# Patient Record
Sex: Male | Born: 1955 | Hispanic: No | Marital: Single
Health system: Southern US, Community
[De-identification: ages and names within clinical notes are randomized; demographics above are authoritative.]

## PROBLEM LIST (undated history)

## (undated) DIAGNOSIS — E785 Hyperlipidemia, unspecified: Secondary | ICD-10-CM

## (undated) DIAGNOSIS — I10 Essential (primary) hypertension: Secondary | ICD-10-CM

---

## 2020-01-23 ENCOUNTER — Other Ambulatory Visit: Payer: Self-pay

## 2020-01-23 ENCOUNTER — Emergency Department (HOSPITAL_COMMUNITY)
Admission: EM | Admit: 2020-01-23 | Discharge: 2020-01-23 | Disposition: A | Discharge status: Home / Self Care | Payer: Self-pay | Attending: Emergency Medicine | Admitting: Emergency Medicine

## 2020-01-23 ENCOUNTER — Emergency Department (HOSPITAL_COMMUNITY): Payer: Self-pay

## 2020-01-23 ENCOUNTER — Encounter (HOSPITAL_COMMUNITY): Payer: Self-pay | Admitting: Emergency Medicine

## 2020-01-23 DIAGNOSIS — R110 Nausea: Secondary | ICD-10-CM | POA: Insufficient documentation

## 2020-01-23 DIAGNOSIS — R1011 Right upper quadrant pain: Secondary | ICD-10-CM | POA: Insufficient documentation

## 2020-01-23 DIAGNOSIS — R5383 Other fatigue: Secondary | ICD-10-CM | POA: Insufficient documentation

## 2020-01-23 DIAGNOSIS — Z79899 Other long term (current) drug therapy: Secondary | ICD-10-CM | POA: Insufficient documentation

## 2020-01-23 DIAGNOSIS — I10 Essential (primary) hypertension: Secondary | ICD-10-CM | POA: Insufficient documentation

## 2020-01-23 DIAGNOSIS — Z20822 Contact with and (suspected) exposure to covid-19: Secondary | ICD-10-CM | POA: Insufficient documentation

## 2020-01-23 DIAGNOSIS — R1114 Bilious vomiting: Secondary | ICD-10-CM

## 2020-01-23 HISTORY — DX: Essential (primary) hypertension: I10

## 2020-01-23 HISTORY — DX: Hyperlipidemia, unspecified: E78.5

## 2020-01-23 LAB — CBC WITH DIFFERENTIAL/PLATELET
Abs Immature Granulocytes: 0 10*3/uL (ref 0.00–0.07)
Basophils Absolute: 0 10*3/uL (ref 0.0–0.1)
Basophils Relative: 0 %
Eosinophils Absolute: 0 10*3/uL (ref 0.0–0.5)
Eosinophils Relative: 0 %
HCT: 41.2 % (ref 39.0–52.0)
Hemoglobin: 12.9 g/dL — ABNORMAL LOW (ref 13.0–17.0)
Lymphocytes Relative: 28 %
Lymphs Abs: 4 10*3/uL (ref 0.7–4.0)
MCH: 24.4 pg — ABNORMAL LOW (ref 26.0–34.0)
MCHC: 31.3 g/dL (ref 30.0–36.0)
MCV: 78 fL — ABNORMAL LOW (ref 80.0–100.0)
Monocytes Absolute: 0.1 10*3/uL (ref 0.1–1.0)
Monocytes Relative: 1 %
Neutro Abs: 10.2 10*3/uL — ABNORMAL HIGH (ref 1.7–7.7)
Neutrophils Relative %: 71 %
Platelets: 239 10*3/uL (ref 150–400)
RBC: 5.28 MIL/uL (ref 4.22–5.81)
RDW: 15.4 % (ref 11.5–15.5)
WBC: 14.3 10*3/uL — ABNORMAL HIGH (ref 4.0–10.5)
nRBC: 0 % (ref 0.0–0.2)
nRBC: 0 /100 WBC

## 2020-01-23 LAB — COMPREHENSIVE METABOLIC PANEL
ALT: 17 U/L (ref 0–44)
AST: 20 U/L (ref 15–41)
Albumin: 4 g/dL (ref 3.5–5.0)
Alkaline Phosphatase: 40 U/L (ref 38–126)
Anion gap: 14 (ref 5–15)
BUN: 20 mg/dL (ref 8–23)
CO2: 22 mmol/L (ref 22–32)
Calcium: 9.6 mg/dL (ref 8.9–10.3)
Chloride: 100 mmol/L (ref 98–111)
Creatinine, Ser: 0.96 mg/dL (ref 0.61–1.24)
GFR calc Af Amer: >60 mL/min (ref >60)
GFR calc non Af Amer: >60 mL/min (ref >60)
Glucose, Bld: 129 mg/dL — ABNORMAL HIGH (ref 70–99)
Potassium: 3.4 mmol/L — ABNORMAL LOW (ref 3.5–5.1)
Sodium: 136 mmol/L (ref 135–145)
Total Bilirubin: 0.2 mg/dL — ABNORMAL LOW (ref 0.3–1.2)
Total Protein: 8 g/dL (ref 6.5–8.1)

## 2020-01-23 LAB — URINALYSIS, ROUTINE W REFLEX MICROSCOPIC
Bilirubin Urine: NEGATIVE
Glucose, UA: 50 mg/dL — AB
Hgb urine dipstick: NEGATIVE
Ketones, ur: 5 mg/dL — AB
Leukocytes,Ua: NEGATIVE
Nitrite: NEGATIVE
Protein, ur: NEGATIVE mg/dL
Specific Gravity, Urine: 1.011 (ref 1.005–1.030)
pH: 7 (ref 5.0–8.0)

## 2020-01-23 LAB — RESPIRATORY PANEL BY RT PCR (FLU A&B, COVID)
Influenza A by PCR: NEGATIVE
Influenza B by PCR: NEGATIVE
SARS Coronavirus 2 by RT PCR: NEGATIVE

## 2020-01-23 LAB — LIPASE, BLOOD: Lipase: 22 U/L (ref 11–51)

## 2020-01-23 LAB — TROPONIN I (HIGH SENSITIVITY): Troponin I (High Sensitivity): 4 ng/L (ref <18)

## 2020-01-23 MED ORDER — DICYCLOMINE HCL 20 MG PO TABS: 20.0000 mg | ORAL_TABLET | Freq: Two times a day (BID) | ORAL | 0 refills | Status: AC

## 2020-01-23 MED ORDER — DICYCLOMINE HCL 10 MG PO CAPS
10.0000 mg | ORAL_CAPSULE | Freq: Once | ORAL | Status: AC
  Administered 2020-01-23: 10 mg via ORAL
  Filled 2020-01-23: qty 1

## 2020-01-23 MED ORDER — METOCLOPRAMIDE HCL 10 MG PO TABS: 10.0000 mg | ORAL_TABLET | Freq: Four times a day (QID) | ORAL | 0 refills | Status: AC | PRN

## 2020-01-23 MED ORDER — METOCLOPRAMIDE HCL 5 MG/ML IJ SOLN
10.0000 mg | Freq: Once | INTRAMUSCULAR | Status: AC
  Administered 2020-01-23: 10 mg via INTRAVENOUS
  Filled 2020-01-23: qty 2

## 2020-01-23 NOTE — ED Notes (Signed)
Pt st's he does not feel well enough to be discharged.  St's he continues to be dizzy.  Explained to pt that he had two Rx's that would help with his symptoms.  Pt requesting to speak to provider.

## 2020-01-23 NOTE — Discharge Instructions (Addendum)
   Solicite ayuda inmediatamente si: [employer], el cuello, los b[place] o la [place]. Se siente muy dbil o se desmaya. Vomita y el vmito es de color rojo intenso o se asemeja al poso del caf. Tiene heces con sangre, de color negro, o con aspecto alquitranado. Siente dolor de cabeza intenso, rigidez en el cuello, o ambas cosas. Tiene dolor intenso, clicos o distensin abdominal. Tiene dificultades para respirar o respira muy rpido. Su corazn late muy rpidamente. Siente la piel fra y hmeda. Se siente confundido. Tiene signos de deshidratacin, como los siguientes: Orina de color oscuro, muy escasa o falta de [place]. Labios agrietados. Sequedad de boca. Ojos hundidos. Somnolencia. Debilidad.

## 2020-01-23 NOTE — ED Notes (Signed)
Pt remains in ultrasound at this time.

## 2020-01-23 NOTE — ED Provider Notes (Signed)
[facility] EMERGENCY DEPARTMENT Provider Note   CSN: [id number] Arrival date & time: 01/23/20  1205     History Chief Complaint  Patient presents with  . Dizziness  . Abdominal Pain    Cody Reeves is a 64 y.o. male with past medical history hyperlipidemia and hypertension who presents emergency department for abdominal discomfort.  There is a language barrier and translation services are utilized however the history is limited and somewhat contradictory.  Essentially states that he ate since that time he has felt "generally weak and lightheaded.  He denies room spinning dizziness.  He feels fatigued.  He has had some nausea without vomiting.  He was able to eat eat oatmeal for breakfast and drink water without vomiting.  He says that he has no abdominal pain however when asked again he says he has 5 out of 10 abdominal pain in his stomach.  He has no previous surgeries to the abdomen.  He denies chest pain or shortness of breath.  HPI     Past Medical History:  Diagnosis Date  . Hyperlipidemia   . Hypertension     There are no problems to display for this patient.       No family history on file.  Social History   Tobacco Use  . Smoking status: Not on file  Substance Use Topics  . Alcohol use: Not on file  . Drug use: Not on file    Home Medications Prior to Admission medications   Medication Sig Start Date End Date Taking? Authorizing Provider  AMLODIPINE-ATORVASTATIN PO Take by mouth.   Yes [provider]  lisinopril (ZESTRIL) 10 MG tablet Take by mouth daily.   Yes [provider]    Allergies    Patient has no known allergies.  Review of Systems   Review of Systems Ten systems reviewed and are negative for acute change, except as noted in the HPI.   Physical Exam Updated Vital Signs BP 136/74   Pulse 76   Temp (!) 97.5 F (36.4 C) (Oral)   Resp 13   SpO2 98%   Physical Exam Vitals and nursing note  reviewed.  Constitutional:      General: He is not in acute distress.    Appearance: He is well-developed. He is not diaphoretic.  HENT:     Head: Normocephalic and atraumatic.  Eyes:     General: No scleral icterus.    Conjunctiva/sclera: Conjunctivae normal.  Cardiovascular:     Rate and Rhythm: Normal rate and regular rhythm.     Heart sounds: Normal heart sounds.  Pulmonary:     Effort: Pulmonary effort is normal. No respiratory distress.     Breath sounds: Normal breath sounds.  Abdominal:     Palpations: Abdomen is soft.     Tenderness: There is abdominal tenderness in the right upper quadrant and epigastric area.  Musculoskeletal:     Cervical back: Normal range of motion and neck supple.  Skin:    General: Skin is warm and dry.  Neurological:     Mental Status: He is alert.  Psychiatric:        Behavior: Behavior normal.     ED Results / Procedures / Treatments   Labs (all labs ordered are listed, but only abnormal results are displayed) Labs Reviewed  URINALYSIS, ROUTINE W REFLEX MICROSCOPIC - Abnormal; Notable for the following components:      Result Value   Color, Urine STRAW (*)  Glucose, UA 50 (*)    Ketones, ur 5 (*)    All other components within normal limits  RESPIRATORY PANEL BY RT PCR (FLU A&B, COVID)  CBC WITH DIFFERENTIAL/PLATELET  COMPREHENSIVE METABOLIC PANEL  LIPASE, BLOOD  TROPONIN I (HIGH SENSITIVITY)    EKG EKG Interpretation  Date/Time:  Tuesday January 23 2020 12:47:19 EST Ventricular Rate:  79 PR Interval:    QRS Duration: 116 QT Interval:  419 QTC Calculation: 481 R Axis:   60 Text Interpretation: Sinus rhythm Incomplete left bundle branch block Probable left ventricular hypertrophy Anterior ST elevation, probably due to LVH Borderline prolonged QT interval no prior available for comparison Confirmed by Tilden Fossa [phone]) on 01/23/2020 1:14:49 PM   Radiology No results found.  Procedures Procedures (including  critical care time)  Medications Ordered in ED Medications - No data to display  ED Course  I have reviewed the triage vital signs and the nursing notes.  Pertinent labs & imaging results that were available during my care of the patient were reviewed by me and considered in my medical decision making (see chart for details).  Clinical Course as of Jan 23 1356  Tue Jan 23, 2020  1355 WBC(!): 14.3 [AH]  1356 Hemoglobin(!): 12.9 [AH]    Clinical Course User Index [AH] Delos Haring   MDM Rules/Calculators/A&P                     64 year old male presents emergency department with nausea.  He is having right upper abdominal tenderness pain on my evaluation.The emergent DDX for RUQ pain includes but is not limited to Glabladder disease, PUD, Acute Hepatitis, Pancreatitis, pyelonephritis, Pneumonia, Lower lobe PE/Infarct, Kidney stone, GERD, retrocecal appendicitis, , AAA, MI, Zoster.  I reviewed the patient's labs which show elevated white blood cell count, microcytic anemia, CMP shows mildly elevated glucose, low bilirubin of insignificant value.  Troponin negative, lipase within normal limits.  Urine without infection.  Respiratory viral panel negative for Covid, flu a and flu B.  I personally reviewed the patient's CT abdomen and pelvis which shows no acute abnormalities but does show some chronic changes of the prostate which will need close outpatient follow-up but does not appear to be emergent.  The patient's right upper quadrant abdomina ultrasound is also negative for acute abnormalities.  Patient is feeling improved without vomiting here in the emergency department.  His nausea is improved.  Will discharge with Reglan and Bentyl.  Advised patient follow closely with urology in the outpatient setting for prostate abnormalities.  Patient discharged with translator service.  He appears appropriate for discharge at this time.  Final Clinical Impression(s) / ED Diagnoses Final  diagnoses:  None    Rx / DC Orders ED Discharge Orders    None       Arthor Captain, PA-C 01/23/20 Ninfa Linden    Tilden Fossa, MD 01/25/20 [phone]

## 2020-01-23 NOTE — ED Triage Notes (Signed)
Pt here from home for one day of generalized abdominal pain, nausea, vomiting, and dizziness. No vomiting with EMS. 4 mg zofran given PTA. Pt speaks Spanish.

## 2020-01-23 NOTE — ED Provider Notes (Signed)
  Physical Exam  BP (!) 145/82   Pulse 74   Temp (!) 97.5 F (36.4 C) (Oral)   Resp 13   SpO2 99%   Physical Exam Vitals and nursing note reviewed.  Constitutional:      General: He is not in acute distress.    Appearance: He is well-developed. He is not diaphoretic.  HENT:     Head: Normocephalic and atraumatic.  Eyes:     General: No scleral icterus.    Conjunctiva/sclera: Conjunctivae normal.  Pulmonary:     Effort: Pulmonary effort is normal. No respiratory distress.  Musculoskeletal:     Cervical back: Normal range of motion.  Skin:    Findings: No rash.  Neurological:     Mental Status: He is alert.     ED Course/Procedures   Clinical Course as of Jan 22 1810  Tue Jan 23, 2020  1355 WBC(!): 14.3 [AH]  1356 Hemoglobin(!): 12.9 [AH]    Clinical Course User Index [AH] Arthor Captain, PA-C    Procedures  MDM  Patient was discharged by PA Harris.  However patient and daughter wanted to speak to provider.  I told patient that his lab work and imaging studies today were reassuring and that his medications should help with his symptoms.  I then spoke to his daughter over the phone who is worried that patient needs home health as he lives by himself and is "refusing to go to the doctor and does not take care of himself."  She was also updated on his lab work and imaging today as well as his medications at discharge.  I have placed a home health order for this patient and he was discharged.   Portions of this note were generated with Scientist, clinical (histocompatibility and immunogenetics). Dictation errors may occur despite best attempts at proofreading.     Dietrich Pates, PA-C 01/23/20 1825    Charlynne Pander, MD 01/23/20 2127

## 2020-09-03 IMAGING — US US ABDOMEN LIMITED
1 series · 14 of 25 positions shown · non-contrast
Comparison: 01/23/2020.

CLINICAL DATA: Nausea.  Emesis.

EXAM:
ULTRASOUND ABDOMEN LIMITED RIGHT UPPER QUADRANT

[Series 1: us abdomen limited · 14 of 28 slices shown]
[im 1/28]
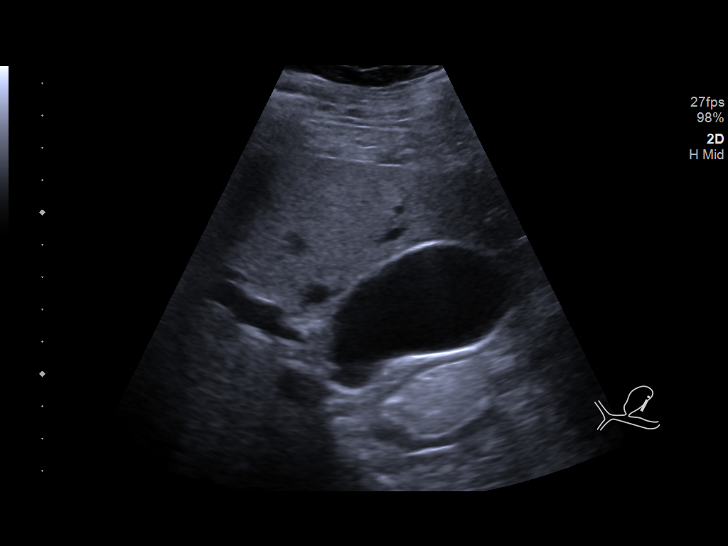
[im 3/28]
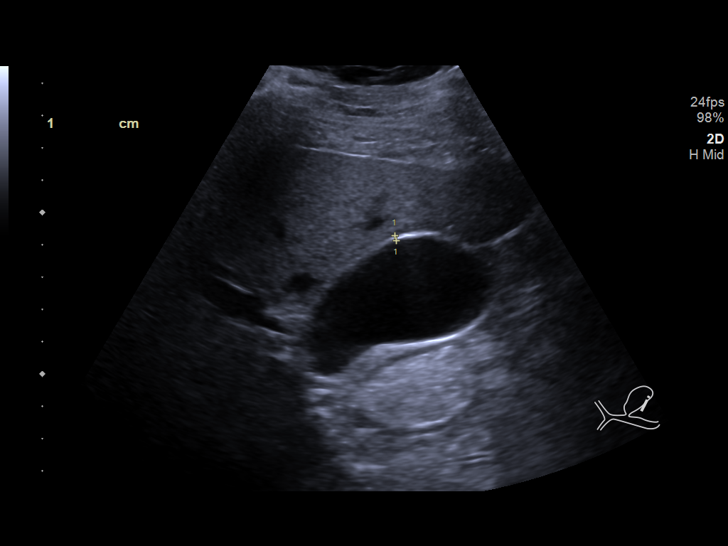
[im 5/28]
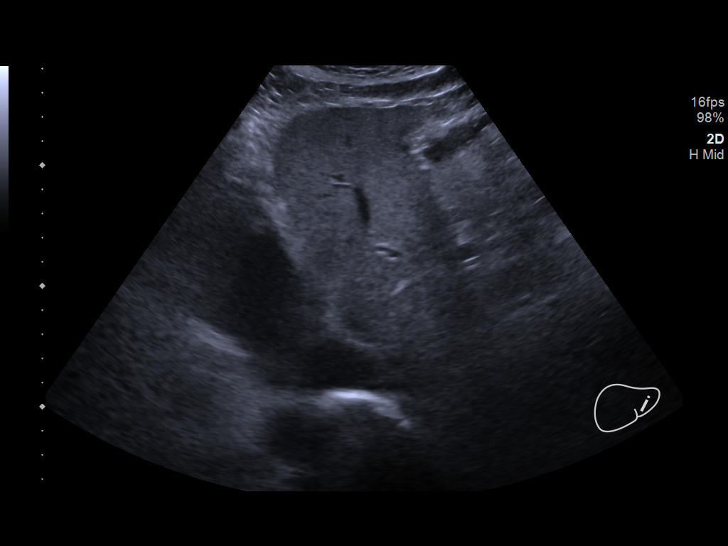
[im 7/28]
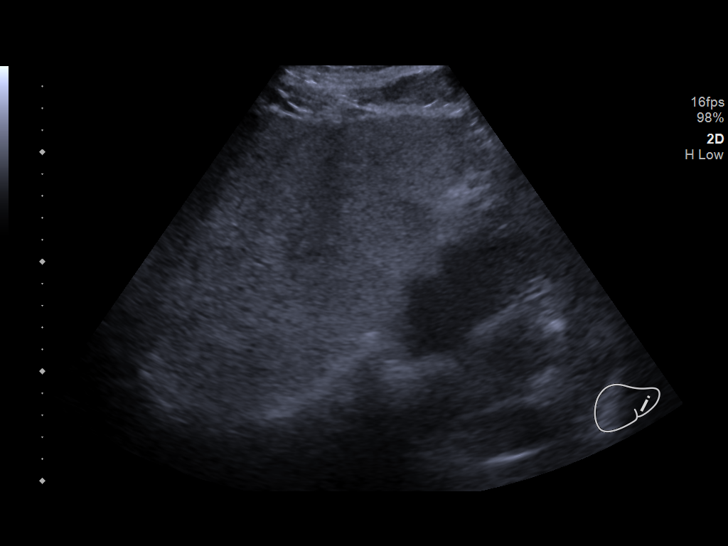
[im 10/28]
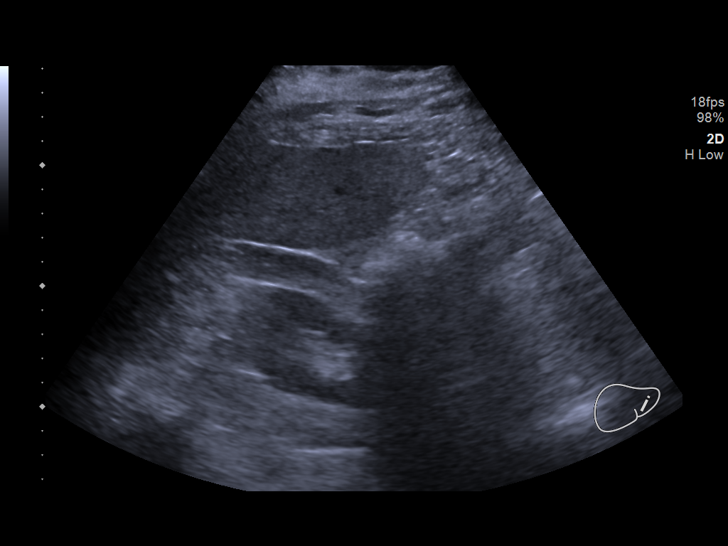
[im 11/28]
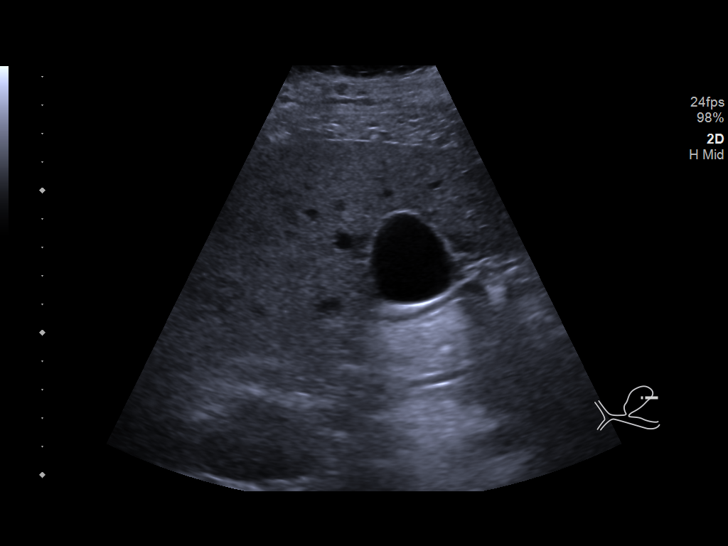
[im 13/28]
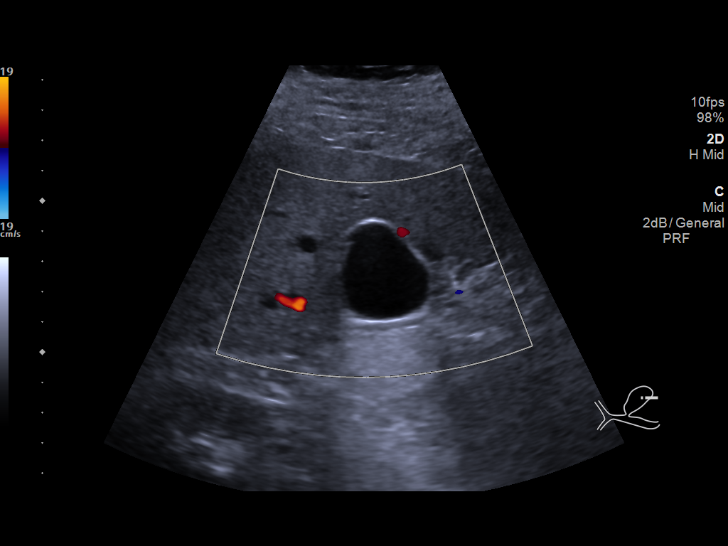
[im 15/28]
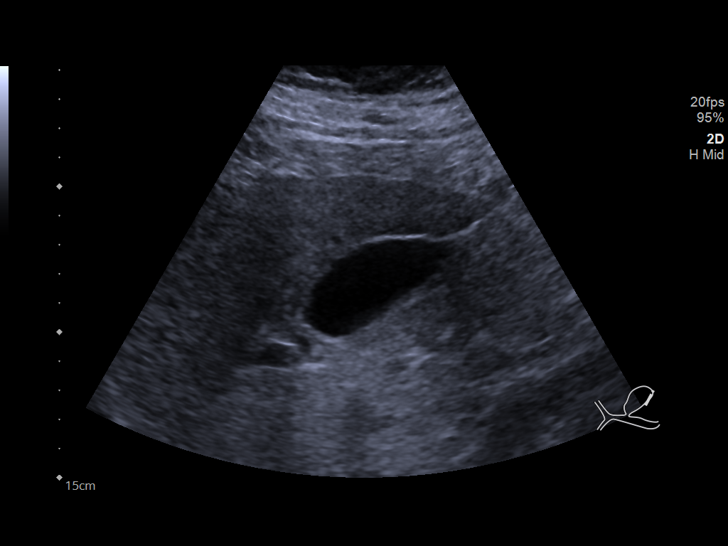
[im 17/28]
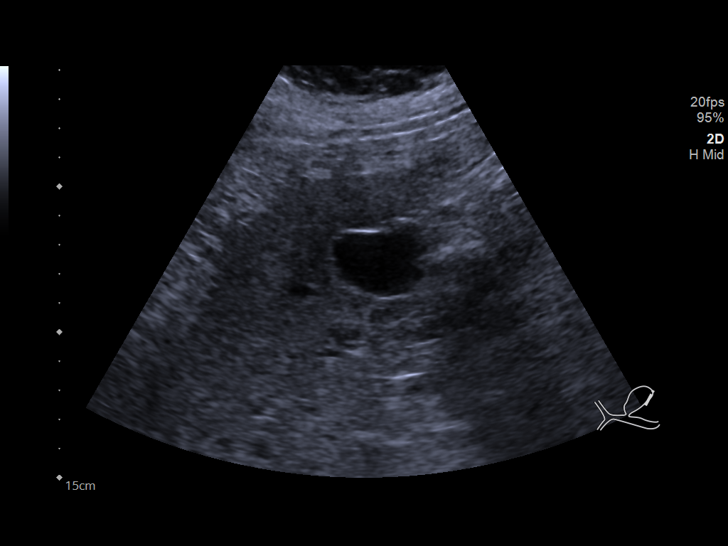
[im 19/28]
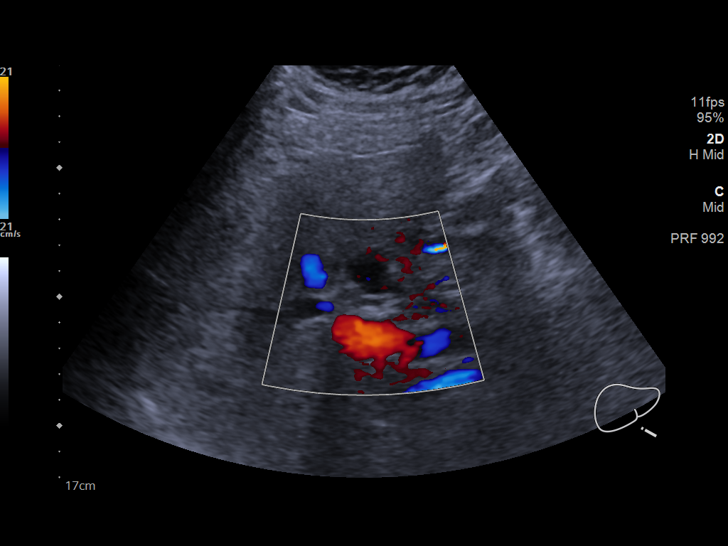
[im 21/28]
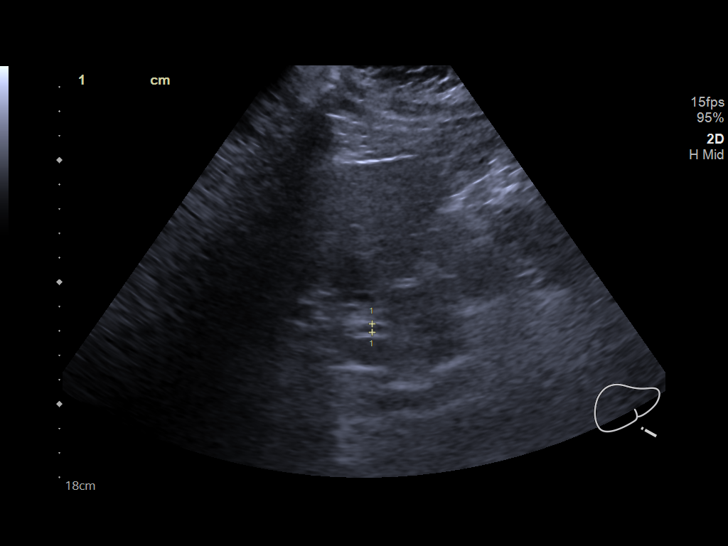
[im 23/28]
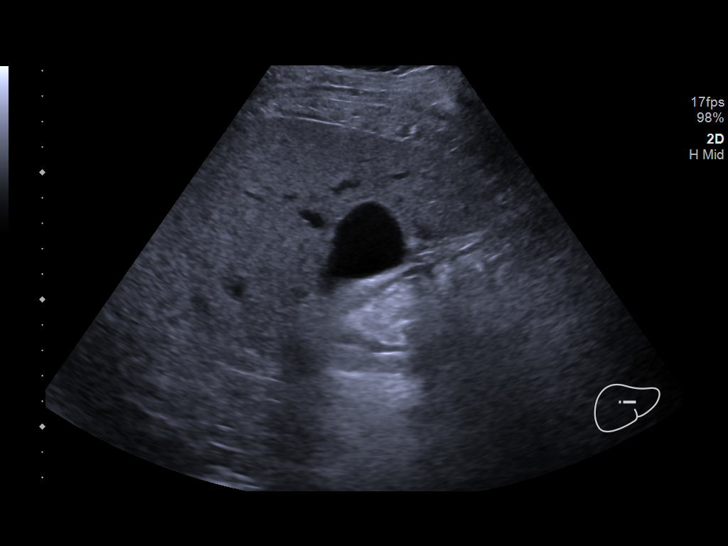
[im 25/28]
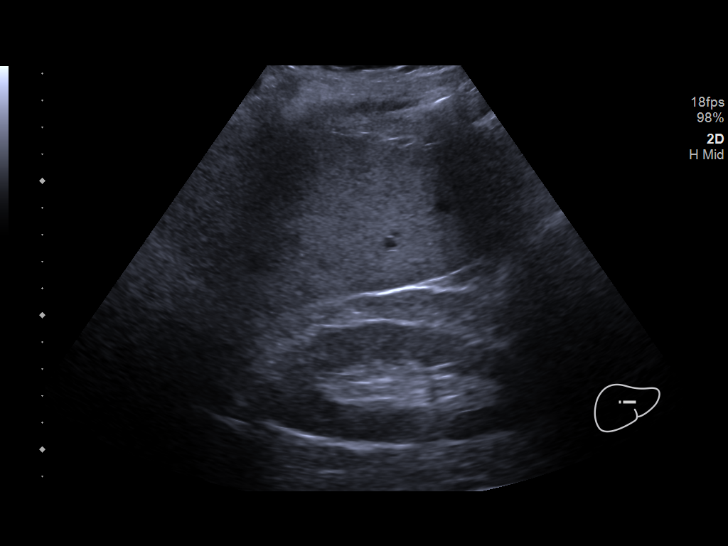
[im 28/28]
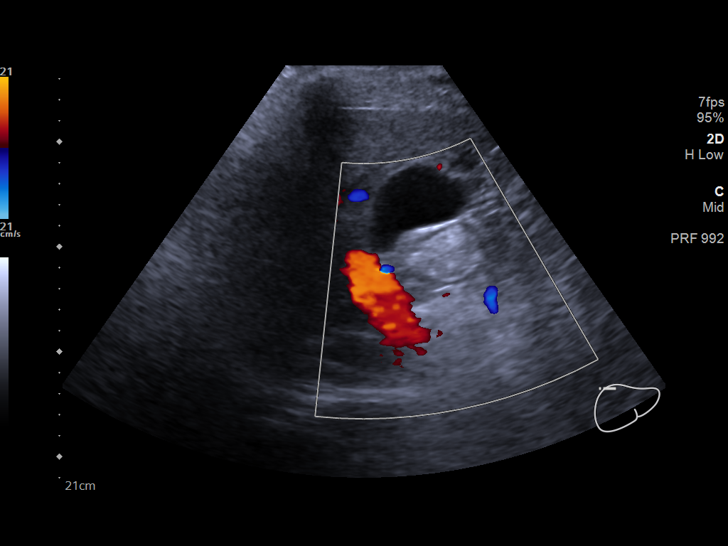

[14 of 25 positions shown; findings below may reference images not displayed]

FINDINGS: Gallbladder:

No gallstones or wall thickening visualized. No sonographic Murphy
sign noted by sonographer.

Common bile duct:

Diameter: 3.5 mm

Liver:

No focal lesion identified. Within normal limits in parenchymal
echogenicity. Portal vein is patent on color Doppler imaging with
normal direction of blood flow towards the liver.

Other: Limited exam due to patient's body habitus.
IMPRESSION: No acute abnormality identified. No gallstones or biliary
distention.

## 2020-09-03 IMAGING — CT CT ABD-PELV W/O CM
2 of 4 series · 16 of 46 positions shown, 18 images · non-contrast
Comparison: None.

CLINICAL DATA: Abdominal pain with nausea and vomiting

EXAM:
CT ABDOMEN AND PELVIS WITHOUT CONTRAST
TECHNIQUE: Multidetector CT imaging of the abdomen and pelvis was performed
following the standard protocol without oral or IV contrast.

[Series 3: a/p w/o 5mm · axial · non-contrast · 0.83mm/px · z∈[+735,+1175]mm · 13 of 96 slices shown, 15 images]
[im 4/96  soft-tissue]
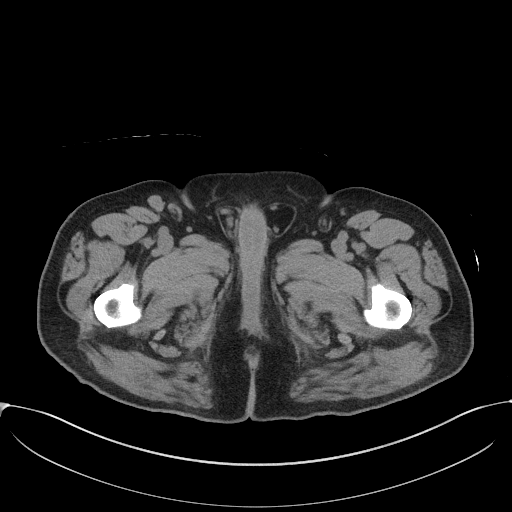
[im 4/96  bone]
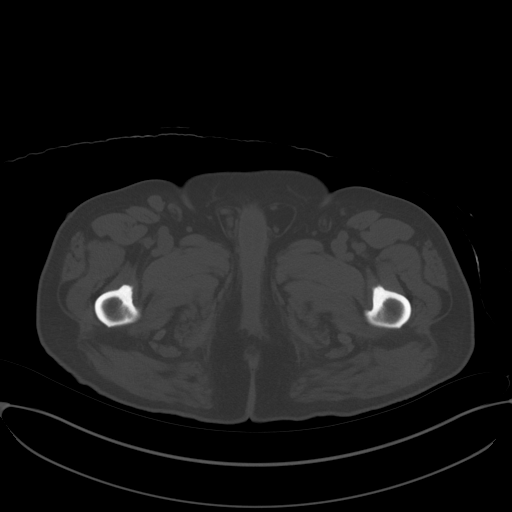
[im 12/96  soft-tissue]
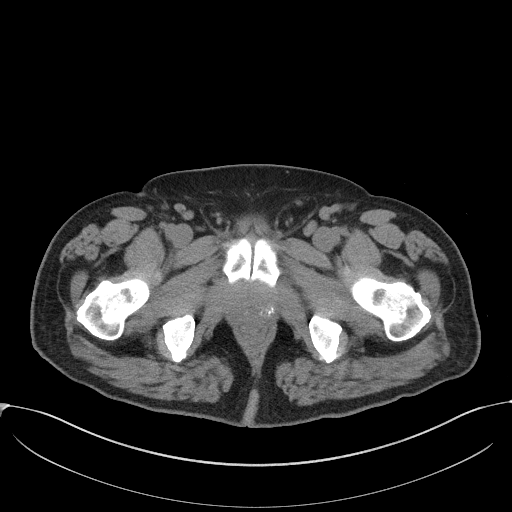
[im 20/96  soft-tissue]
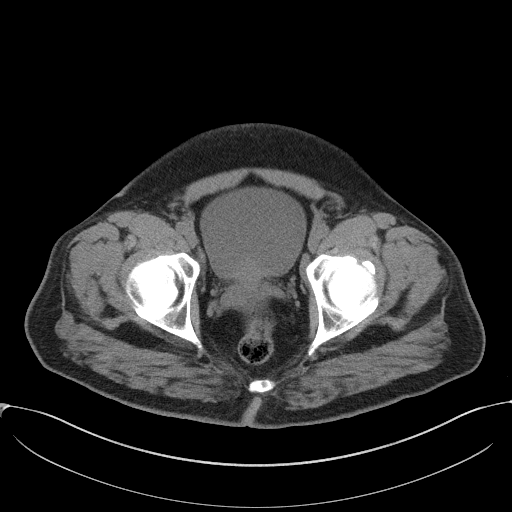
[im 28/96  soft-tissue]
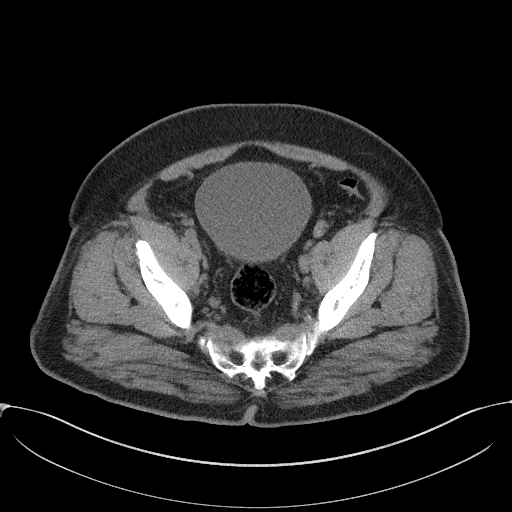
[im 32/96  soft-tissue]
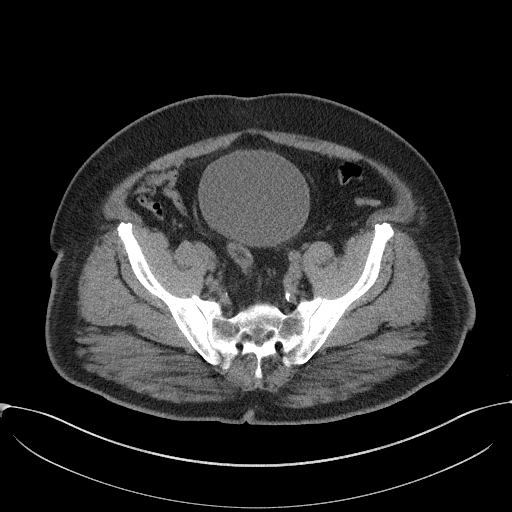
[im 40/96  soft-tissue]
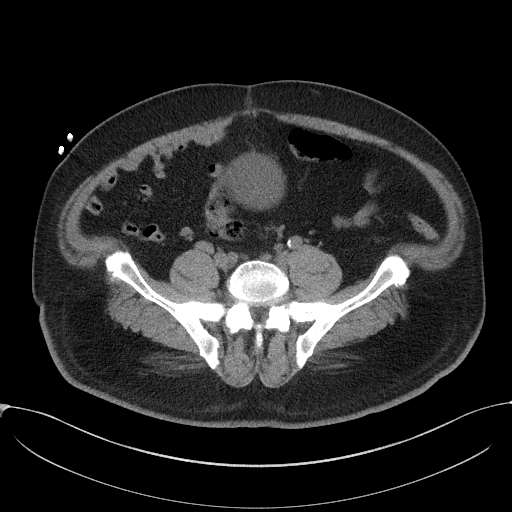
[im 48/96  soft-tissue]
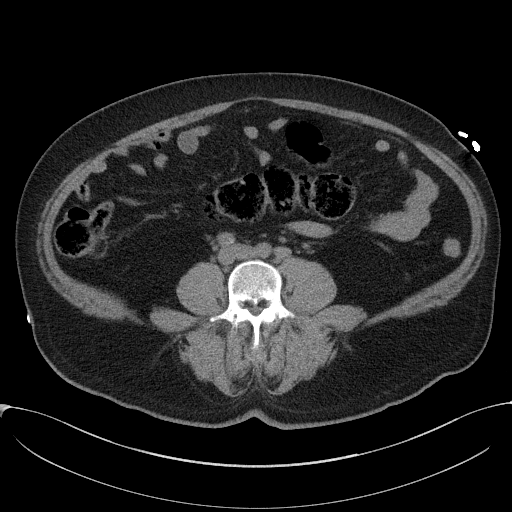
[im 56/96  soft-tissue]
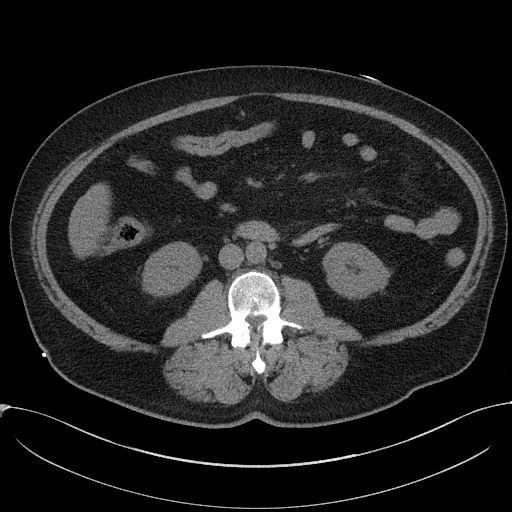
[im 64/96  soft-tissue]
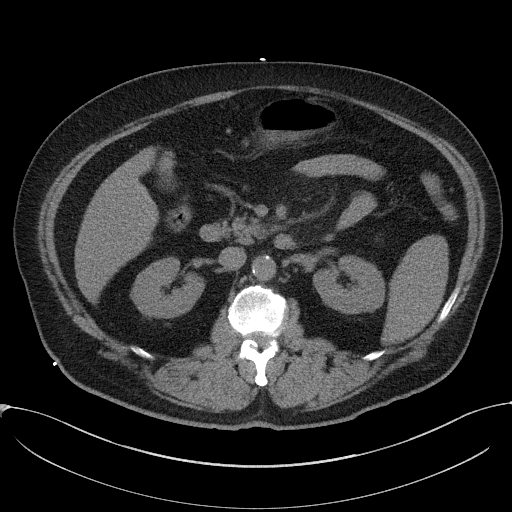
[im 64/96  bone]
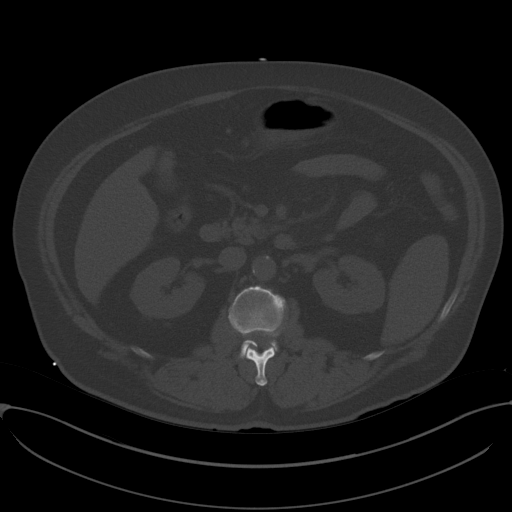
[im 68/96  soft-tissue]
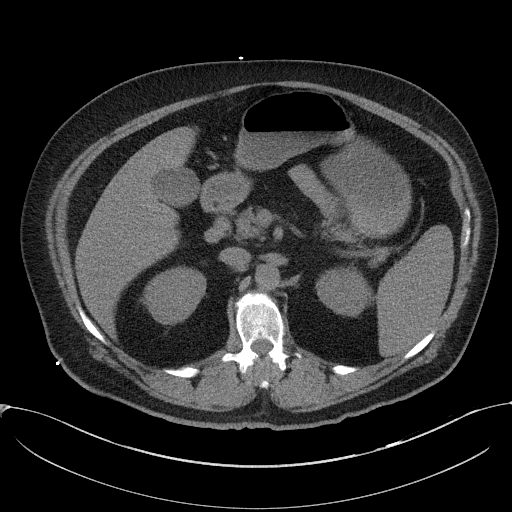
[im 76/96  soft-tissue]
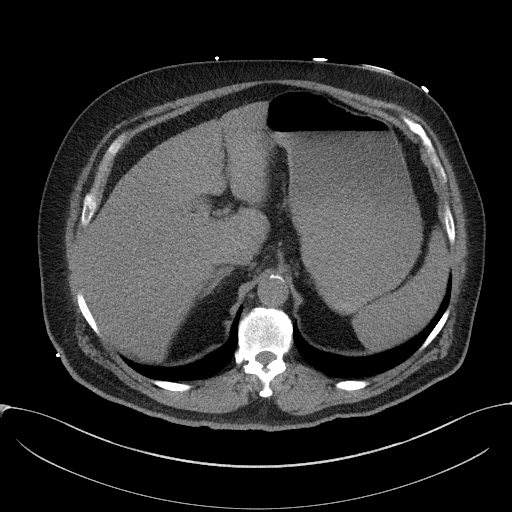
[im 84/96  soft-tissue]
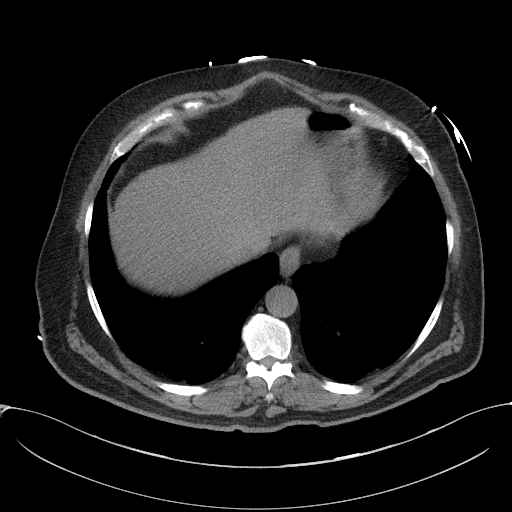
[im 92/96  soft-tissue]
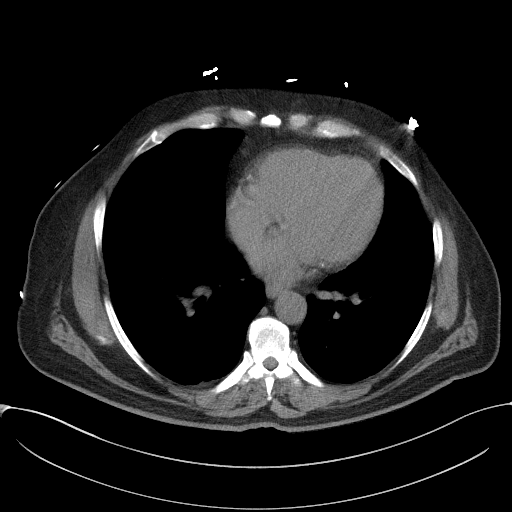

[Series 6: a/p w/o cor · coronal · non-contrast · 0.85mm/px · 3 of 158 slices shown]
[im 53/158  soft-tissue]
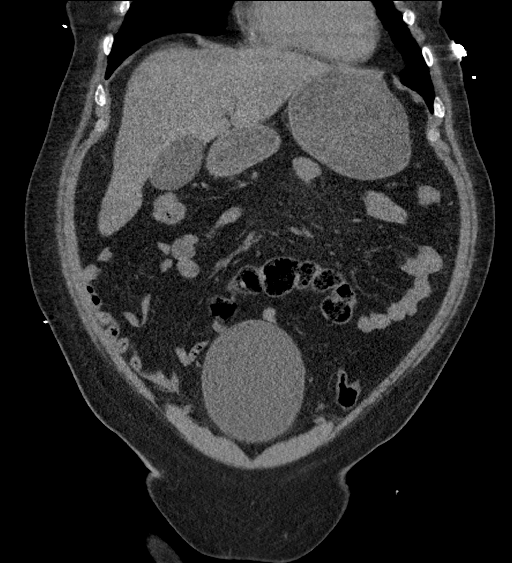
[im 70/158  soft-tissue]
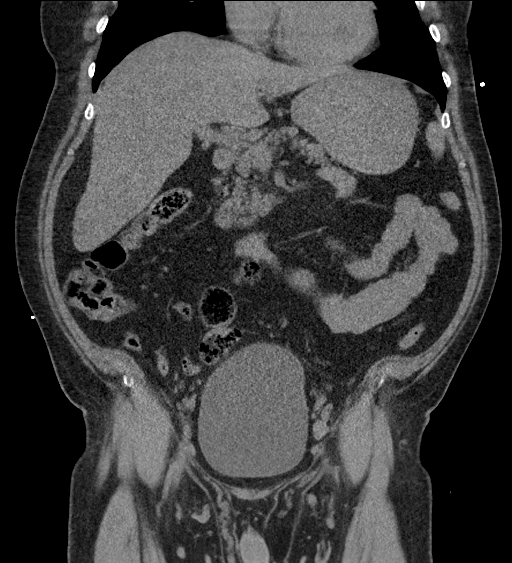
[im 88/158  soft-tissue]
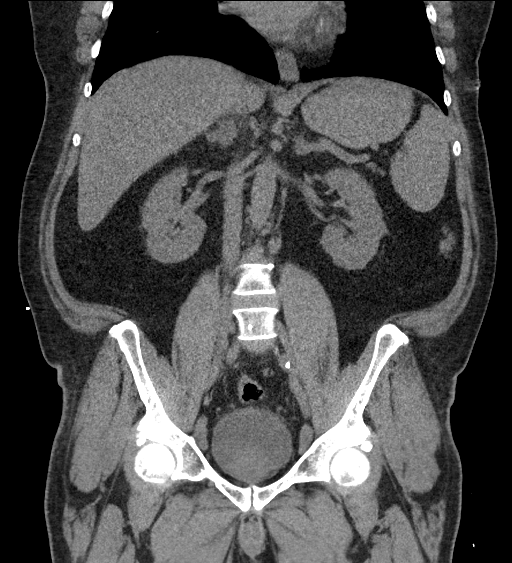

[16 of 46 positions shown; findings below may reference images not displayed]

FINDINGS: Lower chest: Lung bases are clear.

Hepatobiliary: There is hepatic steatosis. No focal liver lesions
are evident on this noncontrast enhanced study. Gallbladder wall is
not appreciably thickened. There is no biliary duct dilatation.

Pancreas: There is no pancreatic mass or inflammatory focus.

Spleen: No splenic lesions are evident.

Adrenals/Urinary Tract: Adrenals bilaterally appear unremarkable.
Kidneys bilaterally show no evident mass or hydronephrosis on either
side. There is no evident renal or ureteral calculus on either side.
Urinary bladder is midline with wall thickness within normal limits.

Stomach/Bowel: There is no appreciable bowel wall or mesenteric
thickening. Stomach is filled with fluid. Gastric wall is not
thickened. There is no evident bowel obstruction. The terminal ileum
appears unremarkable. There is no evident free air or portal venous
air.

Vascular/Lymphatic: There is no abdominal aortic aneurysm. There is
aortic and iliac artery atherosclerotic calcification. No adenopathy
is appreciable in the abdomen or pelvis.

Reproductive: Prostate appears enlarged. The prostate abuts the
inferior aspect of the urinary bladder. There is loss of fat plane
between the prostate and inferior bladder. There are a few prostatic
calculi. Seminal vesicles appear normal bilaterally.

Other: Appendix appears normal. No evident abscess or ascites in the
abdomen or pelvis. There is fat in each inguinal ring.

Musculoskeletal: There is moderate spinal stenosis at L4-5 due to
bony hypertrophy as well as diffuse disc protrusion. Milder spinal
stenosis noted at L3-4 due to similar factors. No blastic or lytic
bone lesions. No intramuscular lesions are evident.
IMPRESSION: 1. No evident bowel wall thickening or bowel obstruction. No abscess
in the abdomen or pelvis. Appendix appears normal.

2. Enlarged prostate with loss of well-defined fat plane between the
inferior a bladder and prostate. This finding warrants clinical
assessment and PSA evaluation. Occasional prostatic calculi noted.

3. No evident renal or ureteral calculus. No hydronephrosis. No
urinary bladder wall thickening evident.

4.  Hepatic steatosis.

5.  Aortic Atherosclerosis ([id number]).

## 2024-04-17 ENCOUNTER — Encounter (INDEPENDENT_AMBULATORY_CARE_PROVIDER_SITE_OTHER): Admitting: Internal Medicine

## 2024-12-25 ENCOUNTER — Institutional Professional Consult (permissible substitution) (INDEPENDENT_AMBULATORY_CARE_PROVIDER_SITE_OTHER): Admitting: Internal Medicine

## 2025-02-26 ENCOUNTER — Institutional Professional Consult (permissible substitution) (INDEPENDENT_AMBULATORY_CARE_PROVIDER_SITE_OTHER): Admitting: Internal Medicine
